# Patient Record
Sex: Female | Born: 1975 | Race: White | Hispanic: No | Marital: Married
Health system: Southern US, Community
[De-identification: ages and names within clinical notes are randomized; demographics above are authoritative.]

## PROBLEM LIST (undated history)

## (undated) HISTORY — PX: OTHER SURGICAL HISTORY: SHX169

## (undated) HISTORY — PX: RHINOPLASTY: SUR1284

---

## 2000-12-28 ENCOUNTER — Other Ambulatory Visit: Admission: RE | Admit: 2000-12-28 | Discharge: 2000-12-28 | Payer: Self-pay | Admitting: Gynecology

## 2018-03-30 ENCOUNTER — Other Ambulatory Visit: Payer: Self-pay | Admitting: Obstetrics and Gynecology

## 2018-03-30 DIAGNOSIS — R921 Mammographic calcification found on diagnostic imaging of breast: Secondary | ICD-10-CM

## 2018-04-07 ENCOUNTER — Ambulatory Visit
Admission: RE | Admit: 2018-04-07 | Discharge: 2018-04-07 | Disposition: A | Discharge status: Home / Self Care | Payer: Managed Care, Other (non HMO) | Source: Ambulatory Visit | Attending: Obstetrics and Gynecology | Admitting: Obstetrics and Gynecology

## 2018-04-07 ENCOUNTER — Other Ambulatory Visit: Payer: Self-pay | Admitting: Obstetrics and Gynecology

## 2018-04-07 DIAGNOSIS — R921 Mammographic calcification found on diagnostic imaging of breast: Secondary | ICD-10-CM

## 2018-10-18 ENCOUNTER — Ambulatory Visit
Admission: RE | Admit: 2018-10-18 | Discharge: 2018-10-18 | Disposition: A | Discharge status: Home / Self Care | Payer: Managed Care, Other (non HMO) | Source: Ambulatory Visit | Attending: Obstetrics and Gynecology | Admitting: Obstetrics and Gynecology

## 2018-10-18 ENCOUNTER — Other Ambulatory Visit: Payer: Self-pay | Admitting: Obstetrics and Gynecology

## 2018-10-18 DIAGNOSIS — R921 Mammographic calcification found on diagnostic imaging of breast: Secondary | ICD-10-CM

## 2019-05-02 ENCOUNTER — Ambulatory Visit
Admission: RE | Admit: 2019-05-02 | Discharge: 2019-05-02 | Disposition: A | Discharge status: Home / Self Care | Payer: Managed Care, Other (non HMO) | Source: Ambulatory Visit | Attending: Obstetrics and Gynecology | Admitting: Obstetrics and Gynecology

## 2019-05-02 ENCOUNTER — Other Ambulatory Visit: Payer: Self-pay

## 2019-05-02 DIAGNOSIS — R921 Mammographic calcification found on diagnostic imaging of breast: Secondary | ICD-10-CM

## 2020-05-07 ENCOUNTER — Other Ambulatory Visit: Payer: Self-pay | Admitting: Obstetrics and Gynecology

## 2020-05-07 DIAGNOSIS — R921 Mammographic calcification found on diagnostic imaging of breast: Secondary | ICD-10-CM

## 2020-05-25 ENCOUNTER — Other Ambulatory Visit: Payer: Self-pay

## 2020-05-25 ENCOUNTER — Ambulatory Visit
Admission: RE | Admit: 2020-05-25 | Discharge: 2020-05-25 | Disposition: A | Discharge status: Home / Self Care | Payer: No Typology Code available for payment source | Source: Ambulatory Visit | Attending: Obstetrics and Gynecology | Admitting: Obstetrics and Gynecology

## 2020-05-25 DIAGNOSIS — R921 Mammographic calcification found on diagnostic imaging of breast: Secondary | ICD-10-CM

## 2022-05-19 IMAGING — MG DIGITAL DIAGNOSTIC BILAT W/ TOMO W/ CAD
9 of 12 series · 9 of 28 positions shown · non-contrast
Comparison: Previous exam(s).

CLINICAL DATA: 44-year-old female for 2 year follow-up of LEFT
breast calcifications and for annual bilateral mammogram.

EXAM:
DIGITAL DIAGNOSTIC BILATERAL MAMMOGRAM WITH CAD AND TOMO

[L CC (1 of 2)]
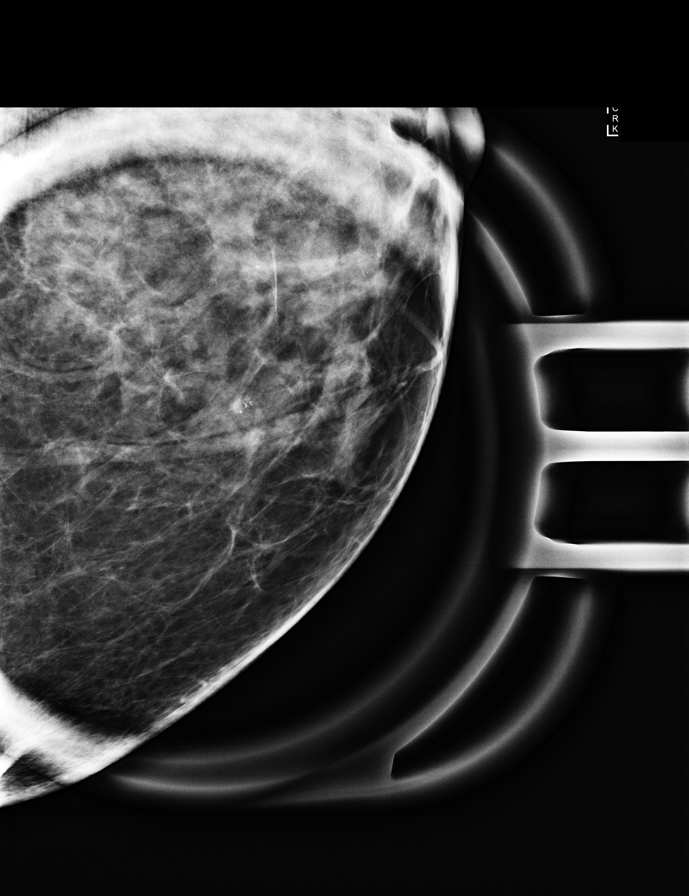

[L CC (2 of 2)]
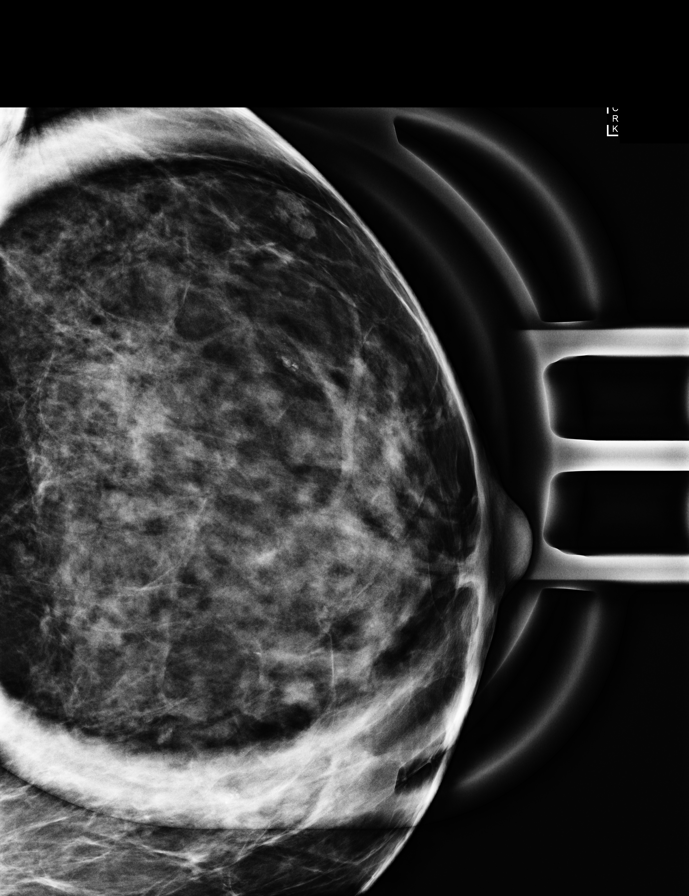

[L ML (1 of 2)]
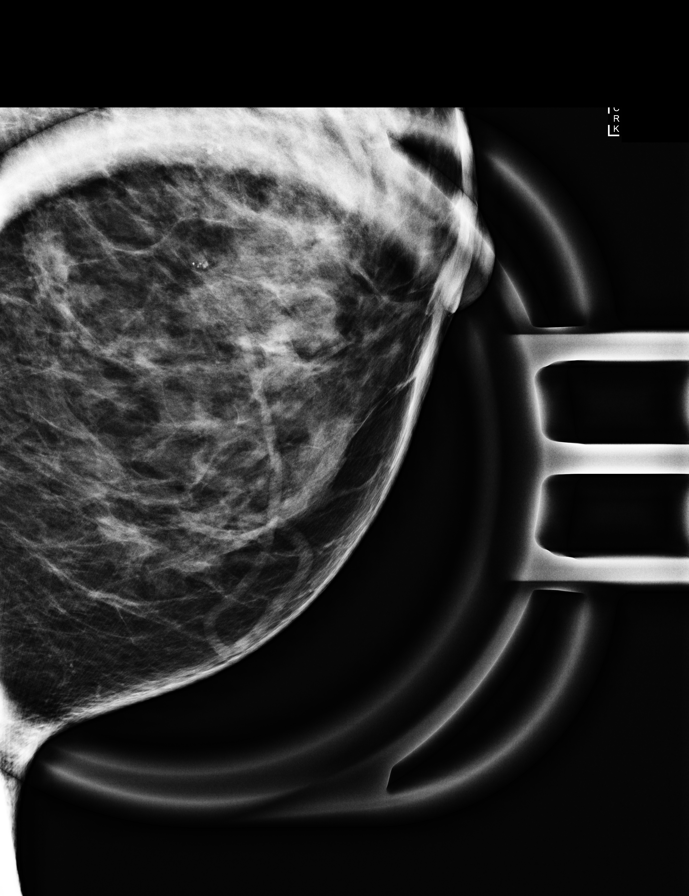

[L ML (2 of 2)]
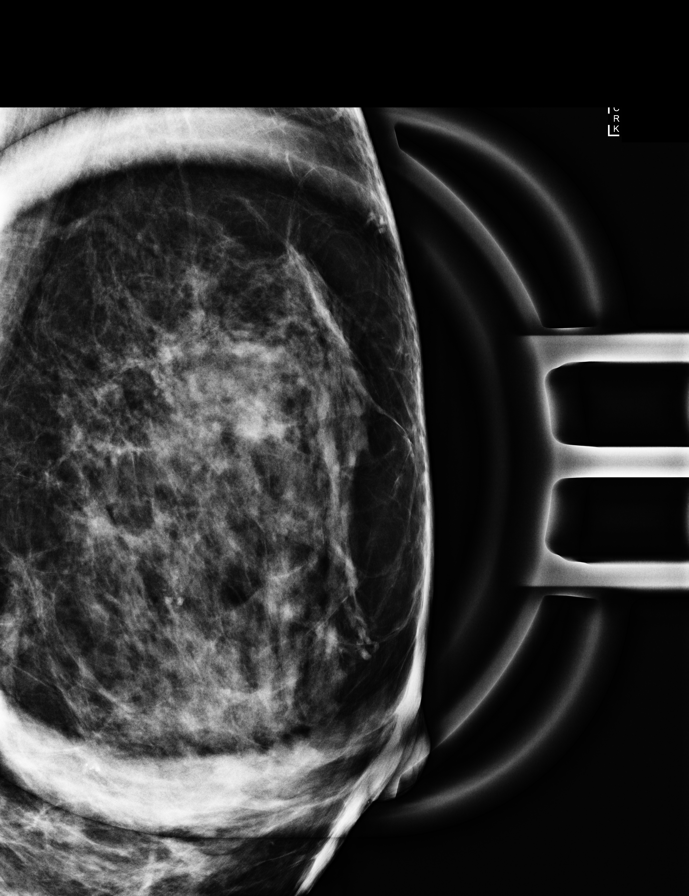

[R MLO synth-2D]
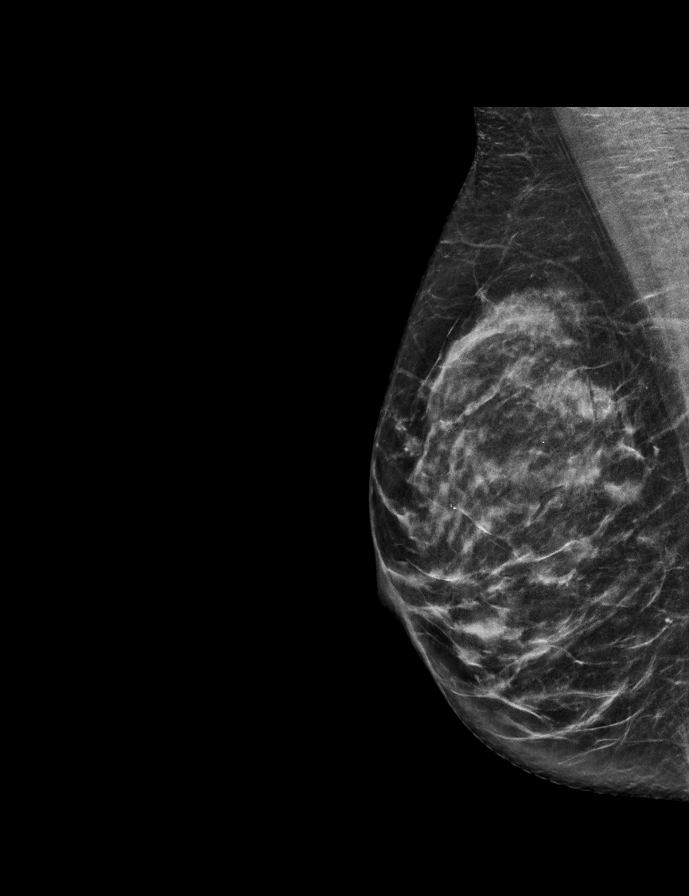

[L CC synth-2D]
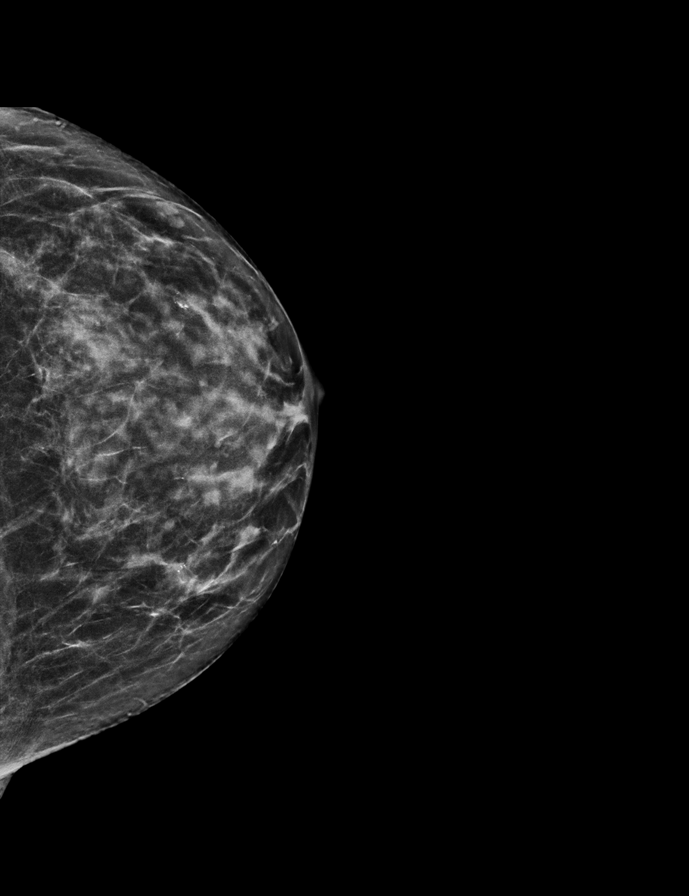

[L MLO synth-2D]
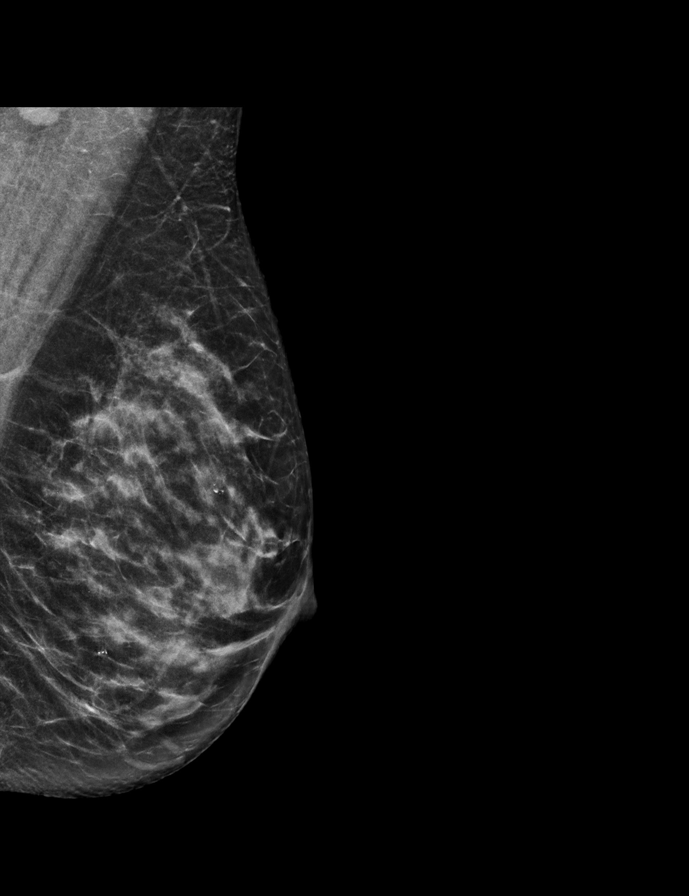

[R CC synth-2D]
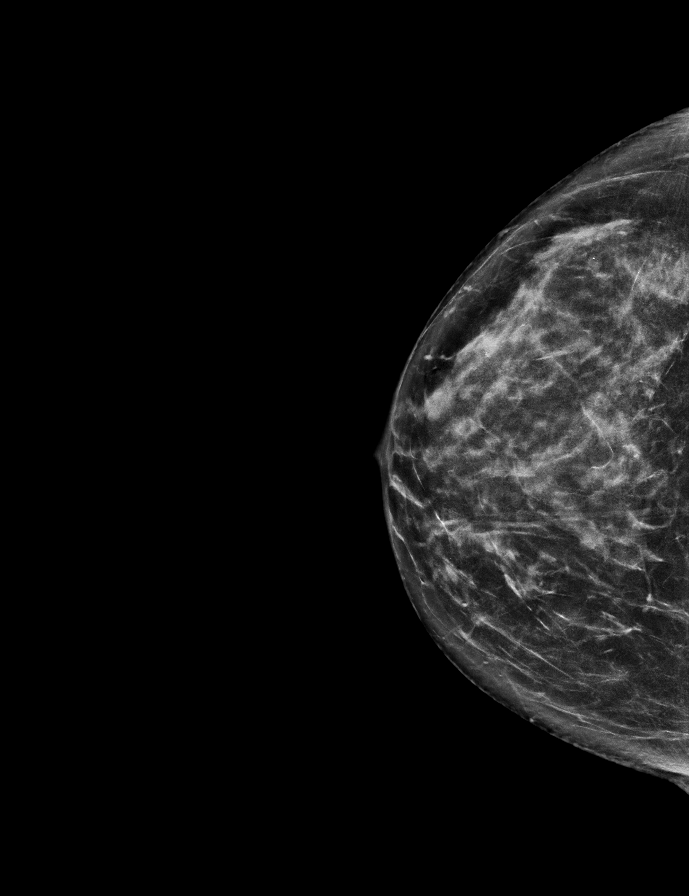

[R CC tomo · tomo slice 31/60.0]
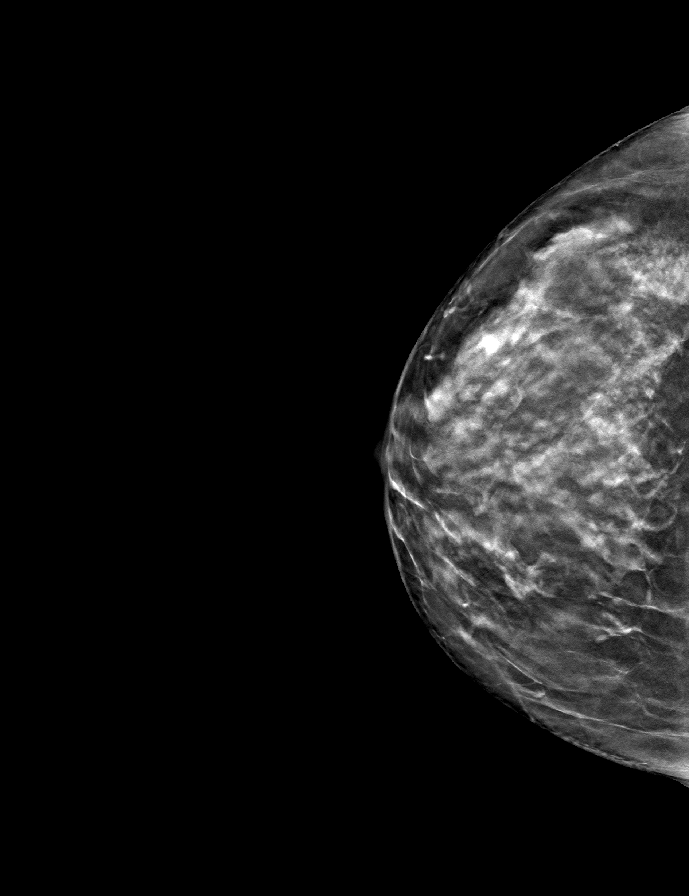

[9 of 28 positions shown; findings below may reference images not displayed]

ACR Breast Density Category c: The breast tissue is heterogeneously
dense, which may obscure small masses.
FINDINGS: 2D/3D full field views of both breast and magnification views of the
LEFT breast demonstrate groups of stable calcifications within the
UPPER-OUTER LEFT breast and LOWER INNER LEFT breast. No new or
suspicious calcifications are identified.

No mammographic evidence of malignancy is noted within either
breast.

Mammographic images were processed with CAD.
IMPRESSION: 1. Stable LEFT breast calcifications for 2 years, considered benign.
2. No mammographic evidence of breast malignancy.

RECOMMENDATION:
Bilateral screening mammogram in 1 year.

I have discussed the findings and recommendations with the patient.
If applicable, a reminder letter will be sent to the patient
regarding the next appointment.

BI-RADS CATEGORY  2: Benign.

## 2022-06-18 ENCOUNTER — Other Ambulatory Visit: Payer: Self-pay

## 2022-06-18 ENCOUNTER — Emergency Department (HOSPITAL_COMMUNITY)
Admission: EM | Admit: 2022-06-18 | Discharge: 2022-06-18 | Disposition: A | Discharge status: Home / Self Care | Payer: No Typology Code available for payment source | Attending: Emergency Medicine | Admitting: Emergency Medicine

## 2022-06-18 ENCOUNTER — Emergency Department (HOSPITAL_COMMUNITY): Payer: No Typology Code available for payment source

## 2022-06-18 ENCOUNTER — Encounter (HOSPITAL_COMMUNITY): Payer: Self-pay | Admitting: Emergency Medicine

## 2022-06-18 DIAGNOSIS — D72829 Elevated white blood cell count, unspecified: Secondary | ICD-10-CM | POA: Diagnosis not present

## 2022-06-18 DIAGNOSIS — R079 Chest pain, unspecified: Secondary | ICD-10-CM | POA: Diagnosis not present

## 2022-06-18 DIAGNOSIS — M940 Chondrocostal junction syndrome [Tietze]: Secondary | ICD-10-CM | POA: Insufficient documentation

## 2022-06-18 DIAGNOSIS — I10 Essential (primary) hypertension: Secondary | ICD-10-CM | POA: Diagnosis not present

## 2022-06-18 DIAGNOSIS — R0602 Shortness of breath: Secondary | ICD-10-CM | POA: Diagnosis not present

## 2022-06-18 DIAGNOSIS — R0789 Other chest pain: Secondary | ICD-10-CM | POA: Diagnosis not present

## 2022-06-18 LAB — I-STAT BETA HCG BLOOD, ED (MC, WL, AP ONLY): I-stat hCG, quantitative: <5 m[IU]/mL (ref <5)

## 2022-06-18 LAB — BASIC METABOLIC PANEL
Anion gap: 11 (ref 5–15)
BUN: 10 mg/dL (ref 6–20)
CO2: 21 mmol/L — ABNORMAL LOW (ref 22–32)
Calcium: 9.3 mg/dL (ref 8.9–10.3)
Chloride: 107 mmol/L (ref 98–111)
Creatinine, Ser: 0.76 mg/dL (ref 0.44–1.00)
GFR, Estimated: >60 mL/min (ref >60)
Glucose, Bld: 121 mg/dL — ABNORMAL HIGH (ref 70–99)
Potassium: 4.3 mmol/L (ref 3.5–5.1)
Sodium: 139 mmol/L (ref 135–145)

## 2022-06-18 LAB — CBC
HCT: 37.7 % (ref 36.0–46.0)
Hemoglobin: 12.4 g/dL (ref 12.0–15.0)
MCH: 30.5 pg (ref 26.0–34.0)
MCHC: 32.9 g/dL (ref 30.0–36.0)
MCV: 92.9 fL (ref 80.0–100.0)
Platelets: 213 10*3/uL (ref 150–400)
RBC: 4.06 MIL/uL (ref 3.87–5.11)
RDW: 13.2 % (ref 11.5–15.5)
WBC: 12.5 10*3/uL — ABNORMAL HIGH (ref 4.0–10.5)
nRBC: 0 % (ref 0.0–0.2)

## 2022-06-18 LAB — TROPONIN I (HIGH SENSITIVITY)
Troponin I (High Sensitivity): 3 ng/L (ref <18)
Troponin I (High Sensitivity): 3 ng/L (ref <18)

## 2022-06-18 LAB — HEPATIC FUNCTION PANEL
ALT: 12 U/L (ref 0–44)
AST: 16 U/L (ref 15–41)
Albumin: 3.1 g/dL — ABNORMAL LOW (ref 3.5–5.0)
Alkaline Phosphatase: 45 U/L (ref 38–126)
Bilirubin, Direct: 0.2 mg/dL (ref 0.0–0.2)
Indirect Bilirubin: 0.5 mg/dL (ref 0.3–0.9)
Total Bilirubin: 0.7 mg/dL (ref 0.3–1.2)
Total Protein: 5.3 g/dL — ABNORMAL LOW (ref 6.5–8.1)

## 2022-06-18 LAB — LIPASE, BLOOD: Lipase: 31 U/L (ref 11–51)

## 2022-06-18 MED ORDER — ALUM & MAG HYDROXIDE-SIMETH 200-200-20 MG/5ML PO SUSP
30.0000 mL | Freq: Once | ORAL | Status: AC
  Administered 2022-06-18: 30 mL via ORAL
  Filled 2022-06-18: qty 30

## 2022-06-18 MED ORDER — MORPHINE SULFATE (PF) 4 MG/ML IV SOLN
4.0000 mg | Freq: Once | INTRAVENOUS | Status: DC
  Filled 2022-06-18: qty 1

## 2022-06-18 MED ORDER — FAMOTIDINE IN NACL 20-0.9 MG/50ML-% IV SOLN
20.0000 mg | Freq: Once | INTRAVENOUS | Status: AC
  Administered 2022-06-18: 20 mg via INTRAVENOUS
  Filled 2022-06-18: qty 50

## 2022-06-18 MED ORDER — ONDANSETRON HCL 4 MG/2ML IJ SOLN
4.0000 mg | Freq: Once | INTRAMUSCULAR | Status: DC
  Filled 2022-06-18: qty 2

## 2022-06-18 MED ORDER — KETOROLAC TROMETHAMINE 15 MG/ML IJ SOLN
15.0000 mg | Freq: Once | INTRAMUSCULAR | Status: AC
  Administered 2022-06-18: 15 mg via INTRAVENOUS
  Filled 2022-06-18: qty 1

## 2022-06-18 NOTE — Discharge Instructions (Addendum)
It was a pleasure taking care of you today!   Your workup was negative in the ED. You may apply ice or heat to the affected area for 15 minutes at a time.  Ensure to place a barrier between your skin and and the ice.  You may use over-the-counter 1,000 mg Tylenol every 6 hours and alternate with 600 mg ibuprofen every 6 hours as needed for pain.  Attached is an ambulatory referral to cardiology, they will call to set up a follow-up appointment regarding today's ED visit.  Follow-up with your primary care provider for evaluation of your symptoms.  You may return to the ED if you are experiencing increasing/worsening chest pain, shortness of breath, or worsening symptoms.

## 2022-06-18 NOTE — ED Notes (Signed)
All discharge instructions including follow up care reviewed with patient and patient verbalized understanding of same. Patient stable and ambulatory at time of discharge.  

## 2022-06-18 NOTE — ED Provider Notes (Signed)
[facility] EMERGENCY DEPARTMENT Provider Note   CSN: [id number] Arrival date & time: 06/18/22  1007     History  Chief Complaint  Patient presents with   Chest Pain    Kendra Jensen is a 46 y.o. female who presents to the emergency department with concerns for central chest pain onset 6 AM.  Notes that she woke up with the pain.  Was able to go to work and noticed that her pain was more severe around 9:30 AM and began to radiate to her jaw.  Notes that she has pain with inspiration.  Patient notes that her pain is currently a 4/10 at this time.  Patient she was playing with her puppy outside yesterday prior to the onset of her symptoms.  Was given 324 mg aspirin prior to arrival.  Notes that she is having trouble with breathing.  Denies nausea, vomiting, diaphoresis.  Denies past medical history of MI, cardiac catheterization, stents, echocardiogram, stress test, diabetes, hypertension.  Denies history of DVT/PE, anticoagulant use, long travel/surgeries.  Patient is currently on OCP at this time.  The history is provided by the patient. No language interpreter was used.       Home Medications Prior to Admission medications   Not on File      Allergies    Patient has no known allergies.    Review of Systems   Review of Systems  Constitutional:  Negative for diaphoresis.  Respiratory:  Negative for shortness of breath.   Cardiovascular:  Positive for chest pain.  Gastrointestinal:  Negative for nausea and vomiting.  All other systems reviewed and are negative.   Physical Exam Updated Vital Signs BP 122/73   Pulse 80   Temp 98 F (36.7 C) (Oral)   Resp 16   Ht 5\' 9"  (1.753 m)   Wt 70.3 kg   SpO2 100%   BMI 22.89 kg/m  Physical Exam Vitals and nursing note reviewed.  Constitutional:      General: She is not in acute distress.    Appearance: She is not diaphoretic.  HENT:     Head: Normocephalic and atraumatic.     Mouth/Throat:      Pharynx: No oropharyngeal exudate.  Eyes:     General: No scleral icterus.    Conjunctiva/sclera: Conjunctivae normal.  Cardiovascular:     Rate and Rhythm: Normal rate and regular rhythm.     Pulses: Normal pulses.     Heart sounds: Normal heart sounds.  Pulmonary:     Effort: Pulmonary effort is normal. No respiratory distress.     Breath sounds: Normal breath sounds. No wheezing.  Chest:     Chest wall: No tenderness.     Comments: No chest wall tenderness to palpation. Abdominal:     General: Bowel sounds are normal.     Palpations: Abdomen is soft. There is no mass.     Tenderness: There is no abdominal tenderness. There is no guarding or rebound.  Musculoskeletal:        General: Normal range of motion.     Cervical back: Normal range of motion and neck supple.  Skin:    General: Skin is warm and dry.  Neurological:     Mental Status: She is alert.  Psychiatric:        Behavior: Behavior normal.     ED Results / Procedures / Treatments   Labs (all labs ordered are listed, but only abnormal results are displayed) Labs Reviewed  BASIC METABOLIC PANEL - Abnormal; Notable for the following components:      Result Value   CO2 21 (*)    Glucose, Bld 121 (*)    All other components within normal limits  CBC - Abnormal; Notable for the following components:   WBC 12.5 (*)    All other components within normal limits  HEPATIC FUNCTION PANEL - Abnormal; Notable for the following components:   Total Protein 5.3 (*)    Albumin 3.1 (*)    All other components within normal limits  LIPASE, BLOOD  I-STAT BETA HCG BLOOD, ED ([facility], [facility], [facility] ONLY)  TROPONIN I (HIGH SENSITIVITY)  TROPONIN I (HIGH SENSITIVITY)    EKG EKG Interpretation  Date/Time:  Wednesday June 18 2022 10:14:00 EDT Ventricular Rate:  91 PR Interval:  167 QRS Duration: 88 QT Interval:  359 QTC Calculation: 442 R Axis:   77 Text Interpretation: Sinus rhythm normal, no old comparison Confirmed by  Arby Barrette [phone]) on 06/18/2022 12:22:08 PM  Radiology DG Chest 2 View  Result Date: 06/18/2022 CLINICAL DATA:  Chest pain and left jaw pain.  Shortness of breath. EXAM: CHEST - 2 VIEW COMPARISON:  None Available. FINDINGS: The heart size and mediastinal contours are within normal limits. Both lungs are clear. 11 degrees of levoconvex lower thoracic scoliosis as measured between T8 and T12. IMPRESSION: 1.  No active cardiopulmonary disease is radiographically apparent. 2. Mild levoconvex lower thoracic scoliosis. Electronically Signed   By: Gaylyn Rong M.D.   On: 06/18/2022 10:43    Procedures Procedures    Medications Ordered in ED Medications  ondansetron (ZOFRAN) injection 4 mg (4 mg Intravenous Not Given 06/18/22 1104)  morphine (PF) 4 MG/ML injection 4 mg (4 mg Intravenous Not Given 06/18/22 1103)  famotidine (PEPCID) IVPB 20 mg premix (0 mg Intravenous Stopped 06/18/22 1448)  alum & mag hydroxide-simeth (MAALOX/MYLANTA) 200-200-20 MG/5ML suspension 30 mL (30 mLs Oral Given 06/18/22 1359)  ketorolac (TORADOL) 15 MG/ML injection 15 mg (15 mg Intravenous Given 06/18/22 1508)    ED Course/ Medical Decision Making/ A&P Clinical Course as of 06/18/22 1719  Wed Jun 18, 2022  1124 Pt re-evaluated and resting comfortably on stretcher. Reviewed patients labs and chest xray findings. Pt notes that she declined the morphine and noted that she just feels uncomfortable at this time. Offered patient toradol, norco, or percocet, patient declines at this time. Offered ice or warm compress, patient agreeable at this time.  [SB]  1224 Re-evaluated and pt noted that her pain is slightly improved to 2/10 at this time. Still notes pain with deep breathing.  [SB]  1429 Re-evaluated and patient noted that her symptoms are still present and at a 2/10 at this time.  [SB]  1600 Re-evaluated and noted improvement of symptoms with treatment regimen to less than 1/10 at this time. Answered all available questions.   Patient ambulated to the bathroom without assistance or difficulty or exacerbation of her symptoms.  Pt appears safe for discharge.  [SB]    Clinical Course User Index [SB] Elijiah Mickley A, PA-C                           Medical Decision Making Amount and/or Complexity of Data Reviewed Labs: ordered. Radiology: ordered.  Risk OTC drugs. Prescription drug management.   Patient presents to the ED with concern for central chest pain onset 6 AM.  Denies past medical history of MI, cardiac catheterization, stents, echocardiogram,  stress test, diabetes, hypertension, DVT/PE, anticoagulant use, long travel/surgeries.  Patient is currently on progestin only OCP at this time.  Patient afebrile.  On exam patient with no acute cardiovascular, respiratory, abdominal exam findings.  Differential diagnosis includes ACS, aortic dissection, pneumonia, pneumothorax, PE, costochondritis.   Labs:  I ordered, and personally interpreted labs.  The pertinent results include:   Initial troponin at 3 delta troponin 3 CBC with mild leukocytosis BMP with slightly elevated glucose, otherwise unremarkable Hepatic function panel otherwise unremarkable. Lipase at 31 unremarkable. I-STAT beta-hCG unremarkable  Imaging: I ordered imaging studies including CXR I independently visualized and interpreted imaging which showed: no acute cardiopulmonary findings I agree with the radiologist interpretation  Medications:  I ordered medication including Toradol, GI cocktail, Pepcid for symptom management Reevaluation of the patient after these medicines and interventions, I reevaluated the patient and found that they have improved I have reviewed the patients home medicines and have made adjustments as needed -Symptoms improved with Toradol in the emergency department.  Disposition: Presentation suspicious for atypical chest pain as well as costochondritis. EKG without acute ST/T changes, troponins negative, chest  x-ray negative, low suspicion for ACS at this time. Chest x-ray without acute findings, vital signs stable, doubt aortic dissection, pneumonia, or pneumothorax at this time.  No risk factors for PE, no unilateral lower extremity swelling, malignancy history, HRT, estrogen containing OCP, recent immobilizations, surgery. Heart score, patient at low risk. Case discussed with attending who agrees with discharge treatment plan at this time. After consideration of the diagnostic results and the patients response to treatment, I feel that the patient would benefit from Discharge home.  Patient provided with ambulatory cardiology referral.  Supportive care measures and strict return precautions discussed with patient at bedside. Pt acknowledges and verbalizes understanding. Pt appears safe for discharge. Follow up as indicated in discharge paperwork.   This chart was dictated using voice recognition software, Dragon. Despite the best efforts of this provider to proofread and correct errors, errors may still occur which can change documentation meaning.   Final Clinical Impression(s) / ED Diagnoses Final diagnoses:  Atypical chest pain  Costochondritis    Rx / DC Orders ED Discharge Orders          Ordered    Ambulatory referral to Cardiology       Comments: If you have not heard from the Cardiology office within the next 72 hours please call [phone].   06/18/22 1608              Johnattan Strassman A, PA-C 06/18/22 1719    Charlesetta Shanks, MD 06/27/22 0010

## 2022-06-18 NOTE — ED Triage Notes (Signed)
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 Patient states when woke up around 6am she had the pain. Around 9:30 at work pain became more severe and into her jaw. Pain worse with inspiration. 324 aspirin taken PTA.

## 2022-08-08 ENCOUNTER — Encounter: Payer: Self-pay | Admitting: Radiology

## 2022-08-08 ENCOUNTER — Ambulatory Visit (INDEPENDENT_AMBULATORY_CARE_PROVIDER_SITE_OTHER): Payer: No Typology Code available for payment source | Admitting: Radiology

## 2022-08-08 VITALS — BP 106/72 | Ht 68.5 in | Wt 155.0 lb

## 2022-08-08 DIAGNOSIS — Z720 Tobacco use: Secondary | ICD-10-CM | POA: Diagnosis not present

## 2022-08-08 DIAGNOSIS — Z3041 Encounter for surveillance of contraceptive pills: Secondary | ICD-10-CM

## 2022-08-08 DIAGNOSIS — Z01419 Encounter for gynecological examination (general) (routine) without abnormal findings: Secondary | ICD-10-CM | POA: Diagnosis not present

## 2022-08-08 MED ORDER — HEATHER 0.35 MG PO TABS: 1.0000 | ORAL_TABLET | Freq: Every day | ORAL | 4 refills | Status: DC

## 2022-08-08 MED ORDER — VARENICLINE TARTRATE 1 MG PO TABS: 1.0000 mg | ORAL_TABLET | Freq: Two times a day (BID) | ORAL | 1 refills | Status: DC

## 2022-08-08 MED ORDER — VARENICLINE TARTRATE (STARTER) 0.5 MG X 11 & 1 MG X 42 PO TBPK: 1.0000 | ORAL_TABLET | ORAL | 0 refills | Status: DC

## 2022-08-08 NOTE — Progress Notes (Signed)
   Kendra Jensen 11-12-1975 [id number]   History:  46 y.o. G0 presents for annual exam, transfer from [facility]. Has started smoking again, would like a prescription for chantix, has used in the past and it worked well.  Gynecologic History No LMP recorded. (Menstrual status: Oral contraceptives).   Contraception/Family planning: oral progesterone-only contraceptive Sexually active: yes Last Pap: 2021. Results were: normal Last mammogram: 06/2021. Results were: normal  Obstetric History OB History  Gravida Para Term Preterm AB Living  0 0 0 0 0 0  SAB IAB Ectopic Multiple Live Births  0 0 0 0 0     The following portions of the patient's history were reviewed and updated as appropriate: allergies, current medications, past family history, past medical history, past social history, past surgical history, and problem list.  Review of Systems Pertinent items noted in HPI and remainder of comprehensive ROS otherwise negative.   Past medical history, past surgical history, family history and social history were all reviewed and documented in the EPIC chart.   Exam:  Vitals:   08/08/22 0943  BP: 106/72  Weight: 155 lb (70.3 kg)  Height: 5' 8.5" (1.74 m)   Body mass index is 23.22 kg/m.  General appearance:  Normal Thyroid:  Symmetrical, normal in size, without palpable masses or nodularity. Respiratory  Auscultation:  Clear without wheezing or rhonchi Cardiovascular  Auscultation:  Regular rate, without rubs, murmurs or gallops  Edema/varicosities:  Not grossly evident Abdominal  Soft,nontender, without masses, guarding or rebound.  Liver/spleen:  No organomegaly noted  Hernia:  None appreciated  Skin  Inspection:  Grossly normal Breasts: Examined lying and sitting.   Right: Without masses, retractions, nipple discharge or axillary adenopathy.   Left: Without masses, retractions, nipple discharge or axillary adenopathy. Genitourinary   Inguinal/mons:  Normal without  inguinal adenopathy  External genitalia:  Normal appearing vulva with no masses, tenderness, or lesions  BUS/Urethra/Skene's glands:  Normal without masses or exudate  Vagina:  Normal appearing with normal color and discharge, no lesions  Cervix:  Normal appearing without discharge or lesions  Uterus:  Normal in size, shape and contour.  Mobile, nontender  Adnexa/parametria:     Rt: Normal in size, without masses or tenderness.   Lt: Normal in size, without masses or tenderness.  Anus and perineum: Normal   Patient informed chaperone available to be present for breast and pelvic exam. Patient has requested no chaperone to be present. Patient has been advised what will be completed during breast and pelvic exam.   Assessment/Plan:   1. Visit for gynecologic examination Pap due 2024 Schedule mamm[facility] at [facility]  2. Oral contraceptive pill surveillance - HEATHER 0.35 MG tablet; Take 1 tablet (0.35 mg total) by mouth daily.  Dispense: 84 tablet; Refill: 4  3. Smoking trying to quit - Varenicline Tartrate, Starter, (CHANTIX STARTING MONTH PAK) 0.5 MG X 11 & 1 MG X 42 TBPK; Take 1 Package by mouth as directed.  Dispense: 1 each; Refill: 0 - varenicline (CHANTIX CONTINUING MONTH PAK) 1 MG tablet; Take 1 tablet (1 mg total) by mouth 2 (two) times daily.  Dispense: 60 tablet; Refill: 1     Discussed SBE, colonoscopy and DEXA screening as directed/appropriate. Recommend 15mins of exercise weekly, including weight bearing exercise. Encouraged the use of seatbelts and sunscreen. Return in 1 year for annual or as needed.   Rubbie Battiest B WHNP-BC 10:04 AM 08/08/2022

## 2022-12-05 DIAGNOSIS — Z1231 Encounter for screening mammogram for malignant neoplasm of breast: Secondary | ICD-10-CM | POA: Diagnosis not present

## 2023-03-27 DIAGNOSIS — J0140 Acute pansinusitis, unspecified: Secondary | ICD-10-CM | POA: Diagnosis not present

## 2023-05-30 DIAGNOSIS — R509 Fever, unspecified: Secondary | ICD-10-CM | POA: Diagnosis not present

## 2023-05-30 DIAGNOSIS — R0981 Nasal congestion: Secondary | ICD-10-CM | POA: Diagnosis not present

## 2023-05-30 DIAGNOSIS — R059 Cough, unspecified: Secondary | ICD-10-CM | POA: Diagnosis not present

## 2023-05-30 DIAGNOSIS — R519 Headache, unspecified: Secondary | ICD-10-CM | POA: Diagnosis not present

## 2023-05-30 DIAGNOSIS — U071 COVID-19: Secondary | ICD-10-CM | POA: Diagnosis not present

## 2023-06-05 DIAGNOSIS — J0100 Acute maxillary sinusitis, unspecified: Secondary | ICD-10-CM | POA: Diagnosis not present

## 2023-07-17 ENCOUNTER — Ambulatory Visit: Payer: No Typology Code available for payment source | Admitting: Radiology

## 2023-08-21 DIAGNOSIS — F17200 Nicotine dependence, unspecified, uncomplicated: Secondary | ICD-10-CM | POA: Diagnosis not present

## 2023-08-21 DIAGNOSIS — Z0000 Encounter for general adult medical examination without abnormal findings: Secondary | ICD-10-CM | POA: Diagnosis not present

## 2023-09-04 ENCOUNTER — Ambulatory Visit: Payer: No Typology Code available for payment source | Admitting: Radiology

## 2023-09-25 ENCOUNTER — Other Ambulatory Visit (HOSPITAL_COMMUNITY)
Admission: RE | Admit: 2023-09-25 | Discharge: 2023-09-25 | Disposition: A | Discharge status: Home / Self Care | Payer: No Typology Code available for payment source | Source: Ambulatory Visit | Attending: Radiology | Admitting: Radiology

## 2023-09-25 ENCOUNTER — Encounter: Payer: Self-pay | Admitting: Radiology

## 2023-09-25 ENCOUNTER — Ambulatory Visit (INDEPENDENT_AMBULATORY_CARE_PROVIDER_SITE_OTHER): Payer: No Typology Code available for payment source | Admitting: Radiology

## 2023-09-25 VITALS — BP 116/78 | HR 90 | Ht 68.5 in | Wt 169.0 lb

## 2023-09-25 DIAGNOSIS — Z01419 Encounter for gynecological examination (general) (routine) without abnormal findings: Secondary | ICD-10-CM | POA: Diagnosis not present

## 2023-09-25 DIAGNOSIS — Z3041 Encounter for surveillance of contraceptive pills: Secondary | ICD-10-CM

## 2023-09-25 DIAGNOSIS — Z720 Tobacco use: Secondary | ICD-10-CM | POA: Diagnosis not present

## 2023-09-25 MED ORDER — HEATHER 0.35 MG PO TABS: 1.0000 | ORAL_TABLET | Freq: Every day | ORAL | 4 refills | Status: DC

## 2023-09-25 MED ORDER — BUPROPION HCL ER (XL) 150 MG PO TB24: 150.0000 mg | ORAL_TABLET | Freq: Every day | ORAL | 4 refills | Status: DC

## 2023-09-25 NOTE — Progress Notes (Signed)
Kendra Jensen April 27, 1976 [id number]   History:  47 y.o. G0 presents for annual exam. Doing well, still smoking. Was started on Wellbutrin by PCP to help, needs a refill, PCP is out on leave. No new gyn concerns. Due for pap.  Gynecologic History No LMP recorded. (Menstrual status: Oral contraceptives).   Contraception/Family planning: oral progesterone-only contraceptive Sexually active: yes Last Pap: 2021. Results were: normal Last mammogram: 2023. Results were: normal  Obstetric History OB History  Gravida Para Term Preterm AB Living  0 0 0 0 0 0  SAB IAB Ectopic Multiple Live Births  0 0 0 0 0    The following portions of the patient's history were reviewed and updated as appropriate: allergies, current medications, past family history, past medical history, past social history, past surgical history, and problem list.  Review of Systems  All other systems reviewed and are negative.   Past medical history, past surgical history, family history and social history were all reviewed and documented in the EPIC chart.  Exam:  Vitals:   09/25/23 0902  BP: 116/78  Pulse: 90  SpO2: 96%  Weight: 169 lb (76.7 kg)  Height: 5' 8.5" (1.74 m)   Body mass index is 25.32 kg/m.  Physical Exam Vitals and nursing note reviewed. Exam conducted with a chaperone present.  Constitutional:      Appearance: Normal appearance. She is normal weight.  HENT:     Head: Normocephalic and atraumatic.  Neck:     Thyroid: No thyroid mass, thyromegaly or thyroid tenderness.  Cardiovascular:     Rate and Rhythm: Regular rhythm.     Heart sounds: Normal heart sounds.  Pulmonary:     Effort: Pulmonary effort is normal.     Breath sounds: Normal breath sounds.  Chest:  Breasts:    Breasts are symmetrical.     Right: Normal. No inverted nipple, mass, nipple discharge, skin change or tenderness.     Left: Normal. No inverted nipple, mass, nipple discharge, skin change or tenderness.   Abdominal:     General: Abdomen is flat. Bowel sounds are normal.     Palpations: Abdomen is soft.  Genitourinary:    General: Normal vulva.     Vagina: Normal. No vaginal discharge, bleeding or lesions.     Cervix: Normal. No discharge or lesion.     Uterus: Normal. Not enlarged and not tender.      Adnexa: Right adnexa normal and left adnexa normal.       Right: No mass, tenderness or fullness.         Left: No mass, tenderness or fullness.    Lymphadenopathy:     Upper Body:     Right upper body: No axillary adenopathy.     Left upper body: No axillary adenopathy.  Skin:    General: Skin is warm and dry.  Neurological:     Mental Status: She is alert and oriented to person, place, and time.  Psychiatric:        Mood and Affect: Mood normal.        Thought Content: Thought content normal.        Judgment: Judgment normal.      Raynelle Fanning, CMA present for exam  Assessment/Plan:   1. Well woman exam with routine gynecological exam (Primary) - Cytology - PAP( [place])  2. Smoking trying to quit - buPROPion (WELLBUTRIN XL) 150 MG 24 hr tablet; Take 1 tablet (150 mg total) by mouth daily.  Dispense: 90 tablet; Refill: 4  3. Oral contraceptive pill surveillance - HEATHER 0.35 MG tablet; Take 1 tablet (0.35 mg total) by mouth daily.  Dispense: 84 tablet; Refill: 4   Discussed SBE, colonoscopy and DEXA screening as directed/appropriate. Recommend of exercise weekly, including weight bearing exercise. Encouraged the use of seatbelts and sunscreen.  Return in about 1 year (around 09/24/2024) for Annual.  Tanda Rockers WHNP-BC 9:26 AM 09/25/2023

## 2023-09-29 LAB — CYTOLOGY - PAP
Comment: NEGATIVE
Diagnosis: NEGATIVE
High risk HPV: NEGATIVE

## 2023-10-16 DIAGNOSIS — R0981 Nasal congestion: Secondary | ICD-10-CM | POA: Diagnosis not present

## 2023-10-16 DIAGNOSIS — R519 Headache, unspecified: Secondary | ICD-10-CM | POA: Diagnosis not present

## 2023-10-16 DIAGNOSIS — R110 Nausea: Secondary | ICD-10-CM | POA: Diagnosis not present

## 2023-10-16 DIAGNOSIS — R051 Acute cough: Secondary | ICD-10-CM | POA: Diagnosis not present

## 2023-12-25 DIAGNOSIS — Z1231 Encounter for screening mammogram for malignant neoplasm of breast: Secondary | ICD-10-CM | POA: Diagnosis not present

## 2024-04-29 ENCOUNTER — Encounter: Payer: Self-pay | Admitting: Advanced Practice Midwife

## 2024-10-10 ENCOUNTER — Encounter: Payer: Self-pay | Admitting: Radiology

## 2024-10-10 ENCOUNTER — Ambulatory Visit (INDEPENDENT_AMBULATORY_CARE_PROVIDER_SITE_OTHER): Payer: Self-pay | Admitting: Radiology

## 2024-10-10 VITALS — BP 122/74 | HR 97 | Ht 70.0 in | Wt 173.0 lb

## 2024-10-10 DIAGNOSIS — Z1211 Encounter for screening for malignant neoplasm of colon: Secondary | ICD-10-CM

## 2024-10-10 DIAGNOSIS — Z3041 Encounter for surveillance of contraceptive pills: Secondary | ICD-10-CM

## 2024-10-10 DIAGNOSIS — Z01419 Encounter for gynecological examination (general) (routine) without abnormal findings: Secondary | ICD-10-CM

## 2024-10-10 DIAGNOSIS — Z720 Tobacco use: Secondary | ICD-10-CM

## 2024-10-10 DIAGNOSIS — Z1331 Encounter for screening for depression: Secondary | ICD-10-CM

## 2024-10-10 MED ORDER — HEATHER 0.35 MG PO TABS: 1.0000 | ORAL_TABLET | Freq: Every day | ORAL | 4 refills | Status: AC

## 2024-10-10 MED ORDER — VARENICLINE TARTRATE (STARTER) 0.5 MG X 11 & 1 MG X 42 PO TBPK: ORAL_TABLET | ORAL | 0 refills | Status: AC

## 2024-10-10 MED ORDER — BUPROPION HCL ER (XL) 150 MG PO TB24: 150.0000 mg | ORAL_TABLET | Freq: Every day | ORAL | 4 refills | Status: AC

## 2024-10-10 NOTE — Patient Instructions (Signed)
 Preventive Care 46-46 Years Old, Female  Preventive care refers to lifestyle choices and visits with your health care provider that can promote health and wellness. Preventive care visits are also called wellness exams.  What can I expect for my preventive care visit?  Counseling  Your health care provider may ask you questions about your:  Medical history, including:  Past medical problems.  Family medical history.  Pregnancy history.  Current health, including:  Menstrual cycle.  Method of birth control.  Emotional well-being.  Home life and relationship well-being.  Sexual activity and sexual health.  Lifestyle, including:  Alcohol, nicotine or tobacco, and drug use.  Access to firearms.  Diet, exercise, and sleep habits.  Work and work Astronomer.  Sunscreen use.  Safety issues such as seatbelt and bike helmet use.  Physical exam  Your health care provider will check your:  Height and weight. These may be used to calculate your BMI (body mass index). BMI is a measurement that tells if you are at a healthy weight.  Waist circumference. This measures the distance around your waistline. This measurement also tells if you are at a healthy weight and may help predict your risk of certain diseases, such as type 2 diabetes and high blood pressure.  Heart rate and blood pressure.  Body temperature.  Skin for abnormal spots.  What immunizations do I need?    Vaccines are usually given at various ages, according to a schedule. Your health care provider will recommend vaccines for you based on your age, medical history, and lifestyle or other factors, such as travel or where you work.  What tests do I need?  Screening  Your health care provider may recommend screening tests for certain conditions. This may include:  Lipid and cholesterol levels.  Diabetes screening. This is done by checking your blood sugar (glucose) after you have not eaten for a while (fasting).  Pelvic exam and Pap test.  Hepatitis B test.  Hepatitis C  test.  HIV (human immunodeficiency virus) test.  STI (sexually transmitted infection) testing, if you are at risk.  Lung cancer screening.  Colorectal cancer screening.  Mammogram. Talk with your health care provider about when you should start having regular mammograms. This may depend on whether you have a family history of breast cancer.  BRCA-related cancer screening. This may be done if you have a family history of breast, ovarian, tubal, or peritoneal cancers.  Bone density scan. This is done to screen for osteoporosis.  Talk with your health care provider about your test results, treatment options, and if necessary, the need for more tests.  Follow these instructions at home:  Eating and drinking    Eat a diet that includes fresh fruits and vegetables, whole grains, lean protein, and low-fat dairy products.  Take vitamin and mineral supplements as recommended by your health care provider.  Do not drink alcohol if:  Your health care provider tells you not to drink.  You are pregnant, may be pregnant, or are planning to become pregnant.  If you drink alcohol:  Limit how much you have to 0-1 drink a day.  Know how much alcohol is in your drink. In the [place] one drink equals one 12 oz bottle of beer (355 mL), one 5 oz glass of wine (148 mL), or one 1 oz glass of hard liquor (44 mL).  Lifestyle  Brush your teeth every morning and night with fluoride toothpaste. Floss one time each day.  Exercise for at least  30 minutes 5 or more days each week.  Do not use any products that contain nicotine or tobacco. These products include cigarettes, chewing tobacco, and vaping devices, such as e-cigarettes. If you need help quitting, ask your health care provider.  Do not use drugs.  If you are sexually active, practice safe sex. Use a condom or other form of protection to prevent STIs.  If you do not wish to become pregnant, use a form of birth control. If you plan to become pregnant, see your health care provider for a  prepregnancy visit.  Take aspirin only as told by your health care provider. Make sure that you understand how much to take and what form to take. Work with your health care provider to find out whether it is safe and beneficial for you to take aspirin daily.  Find healthy ways to manage stress, such as:  Meditation, yoga, or listening to music.  Journaling.  Talking to a trusted person.  Spending time with friends and family.  Minimize exposure to UV radiation to reduce your risk of skin cancer.  Safety  Always wear your seat belt while driving or riding in a vehicle.  Do not drive:  If you have been drinking alcohol. Do not ride with someone who has been drinking.  When you are tired or distracted.  While texting.  If you have been using any mind-altering substances or drugs.  Wear a helmet and other protective equipment during sports activities.  If you have firearms in your house, make sure you follow all gun safety procedures.  Seek help if you have been physically or sexually abused.  What's next?  Visit your health care provider once a year for an annual wellness visit.  Ask your health care provider how often you should have your eyes and teeth checked.  Stay up to date on all vaccines.  This information is not intended to replace advice given to you by your health care provider. Make sure you discuss any questions you have with your health care provider.  Document Revised: 03/27/2021 Document Reviewed: 03/27/2021  Elsevier Patient Education  2024 [employer].

## 2024-10-10 NOTE — Progress Notes (Signed)
 "  Kendra Jensen 10-Dec-1975 [id number]   History:  48 y.o. G0 presents for annual exam. Wants to restart Chantix , Wellbutrin  not working for smoking cessation but works for anxiety and mood.  Gynecologic History No LMP recorded. (Menstrual status: Oral contraceptives).   Contraception/Family planning: OCP (estrogen/progesterone) Sexually active: yes Last Pap: 12/24. Results were: normal Last mammogram: 3/25 [facility]. Results were: normal  Obstetric History OB History  Gravida Para Term Preterm AB Living  0 0 0 0 0 0  SAB IAB Ectopic Multiple Live Births  0 0 0 0 0       10/10/2024    8:07 AM 09/25/2023    9:06 AM  Depression screen PHQ 2/9  Decreased Interest 0 0  Down, Depressed, Hopeless 0 0  PHQ - 2 Score 0 0     The following portions of the patient's history were reviewed and updated as appropriate: allergies, current medications, past family history, past medical history, past social history, past surgical history, and problem list.  Review of Systems  All other systems reviewed and are negative.   Past medical history, past surgical history, family history and social history were all reviewed and documented in the EPIC chart.  Exam:  Vitals:   10/10/24 0806  BP: 122/74  Pulse: 97  SpO2: 98%  Weight: 173 lb (78.5 kg)  Height: 5' 10 (1.778 m)   Body mass index is 24.82 kg/m.  Physical Exam Vitals and nursing note reviewed. Exam conducted with a chaperone present.  Constitutional:      Appearance: Normal appearance. She is normal weight.  HENT:     Head: Normocephalic and atraumatic.  Neck:     Thyroid: No thyroid mass, thyromegaly or thyroid tenderness.  Cardiovascular:     Rate and Rhythm: Regular rhythm.     Heart sounds: Normal heart sounds.  Pulmonary:     Effort: Pulmonary effort is normal.     Breath sounds: Normal breath sounds.  Chest:  Breasts:    Breasts are symmetrical.     Right: Normal. No inverted nipple, mass, nipple  discharge, skin change or tenderness.     Left: Normal. No inverted nipple, mass, nipple discharge, skin change or tenderness.  Abdominal:     General: Abdomen is flat. Bowel sounds are normal.     Palpations: Abdomen is soft.  Genitourinary:    General: Normal vulva.     Vagina: Normal. No vaginal discharge, bleeding or lesions.     Cervix: Normal. No discharge or lesion.     Uterus: Normal. Not enlarged and not tender.      Adnexa: Right adnexa normal and left adnexa normal.       Right: No mass, tenderness or fullness.         Left: No mass, tenderness or fullness.    Lymphadenopathy:     Upper Body:     Right upper body: No axillary adenopathy.     Left upper body: No axillary adenopathy.  Skin:    General: Skin is warm and dry.  Neurological:     Mental Status: She is alert and oriented to person, place, and time.  Psychiatric:        Mood and Affect: Mood normal.        Thought Content: Thought content normal.        Judgment: Judgment normal.      Kendra Jensen, CMA present for exam  Assessment/Plan:   1. Well woman exam with routine gynecological exam (  Primary) Pap 2027 Mammo yearly at [facility]  2. Oral contraceptive pill surveillance - HEATHER  0.35 MG tablet; Take 1 tablet (0.35 mg total) by mouth daily.  Dispense: 84 tablet; Refill: 4  3. Smoking trying to quit - Varenicline  Tartrate, Starter, (CHANTIX  STARTING MONTH PAK) 0.5 MG X 11 & 1 MG X 42 TBPK; Take by mouth as directed  Dispense: 53 each; Refill: 0 - buPROPion  (WELLBUTRIN  XL) 150 MG 24 hr tablet; Take 1 tablet (150 mg total) by mouth daily.  Dispense: 90 tablet; Refill: 4  4. Depression screen  5. Colon cancer screening - Cologuard    Return in about 1 year (around 10/10/2025) for Annual.  Kendra Jensen WHNP-BC 8:27 AM 10/10/2024 "

## 2024-10-20 LAB — COLOGUARD: COLOGUARD: NEGATIVE

## 2024-10-21 ENCOUNTER — Ambulatory Visit: Payer: Self-pay | Admitting: Radiology
